# Patient Record
Sex: Male | Born: 1970 | Race: Black or African American | Hispanic: No | Marital: Single | Smoking: Never smoker
Health system: Southern US, Community
[De-identification: ages and names within clinical notes are randomized; demographics above are authoritative.]

## PROBLEM LIST (undated history)

## (undated) DIAGNOSIS — I10 Essential (primary) hypertension: Secondary | ICD-10-CM

## (undated) DIAGNOSIS — G4730 Sleep apnea, unspecified: Secondary | ICD-10-CM

## (undated) DIAGNOSIS — E119 Type 2 diabetes mellitus without complications: Secondary | ICD-10-CM

## (undated) DIAGNOSIS — G629 Polyneuropathy, unspecified: Secondary | ICD-10-CM

## (undated) DIAGNOSIS — M6281 Muscle weakness (generalized): Secondary | ICD-10-CM

## (undated) DIAGNOSIS — M9420 Chondromalacia, unspecified site: Secondary | ICD-10-CM

## (undated) HISTORY — DX: Sleep apnea, unspecified: G47.30

## (undated) HISTORY — DX: Chondromalacia, unspecified site: M94.20

## (undated) HISTORY — DX: Polyneuropathy, unspecified: G62.9

## (undated) HISTORY — PX: TONSILLECTOMY: SUR1361

## (undated) HISTORY — DX: Muscle weakness (generalized): M62.81

---

## 2010-12-13 DIAGNOSIS — S335XXA Sprain of ligaments of lumbar spine, initial encounter: Secondary | ICD-10-CM

## 2010-12-13 HISTORY — DX: Sprain of ligaments of lumbar spine, initial encounter: S33.5XXA

## 2017-04-30 ENCOUNTER — Other Ambulatory Visit: Payer: Self-pay

## 2017-04-30 ENCOUNTER — Emergency Department (HOSPITAL_COMMUNITY)
Admission: EM | Admit: 2017-04-30 | Discharge: 2017-04-30 | Discharge status: Against Medical Advice | Payer: Medicaid Other | Attending: Emergency Medicine | Admitting: Emergency Medicine

## 2017-04-30 ENCOUNTER — Encounter (HOSPITAL_COMMUNITY): Payer: Self-pay

## 2017-04-30 ENCOUNTER — Emergency Department (HOSPITAL_COMMUNITY): Payer: Medicaid Other

## 2017-04-30 DIAGNOSIS — R0789 Other chest pain: Secondary | ICD-10-CM | POA: Diagnosis present

## 2017-04-30 DIAGNOSIS — Z5321 Procedure and treatment not carried out due to patient leaving prior to being seen by health care provider: Secondary | ICD-10-CM | POA: Diagnosis not present

## 2017-04-30 HISTORY — DX: Type 2 diabetes mellitus without complications: E11.9

## 2017-04-30 LAB — BASIC METABOLIC PANEL
Anion gap: 9 (ref 5–15)
BUN: 10 mg/dL (ref 6–20)
CALCIUM: 9.1 mg/dL (ref 8.9–10.3)
CO2: 31 mmol/L (ref 22–32)
CREATININE: 1 mg/dL (ref 0.61–1.24)
Chloride: 99 mmol/L — ABNORMAL LOW (ref 101–111)
GFR calc Af Amer: >60 mL/min (ref >60)
GLUCOSE: 260 mg/dL — AB (ref 65–99)
Potassium: 4.6 mmol/L (ref 3.5–5.1)
Sodium: 139 mmol/L (ref 135–145)

## 2017-04-30 LAB — CBC
HCT: 41.9 % (ref 39.0–52.0)
HEMOGLOBIN: 13.5 g/dL (ref 13.0–17.0)
MCH: 23.7 pg — AB (ref 26.0–34.0)
MCHC: 32.2 g/dL (ref 30.0–36.0)
MCV: 73.5 fL — ABNORMAL LOW (ref 78.0–100.0)
Platelets: 164 10*3/uL (ref 150–400)
RBC: 5.7 MIL/uL (ref 4.22–5.81)
RDW: 16 % — AB (ref 11.5–15.5)
WBC: 9.7 10*3/uL (ref 4.0–10.5)

## 2017-04-30 LAB — I-STAT TROPONIN, ED: TROPONIN I, POC: 0 ng/mL (ref 0.00–0.08)

## 2017-04-30 IMAGING — CR DG CHEST 2V
2 series · 2 of 2 positions shown · non-contrast
Comparison: None.

CLINICAL DATA: Chest discomfort.  Shortness of breath.

EXAM:
CHEST  2 VIEW

[w chest pa]
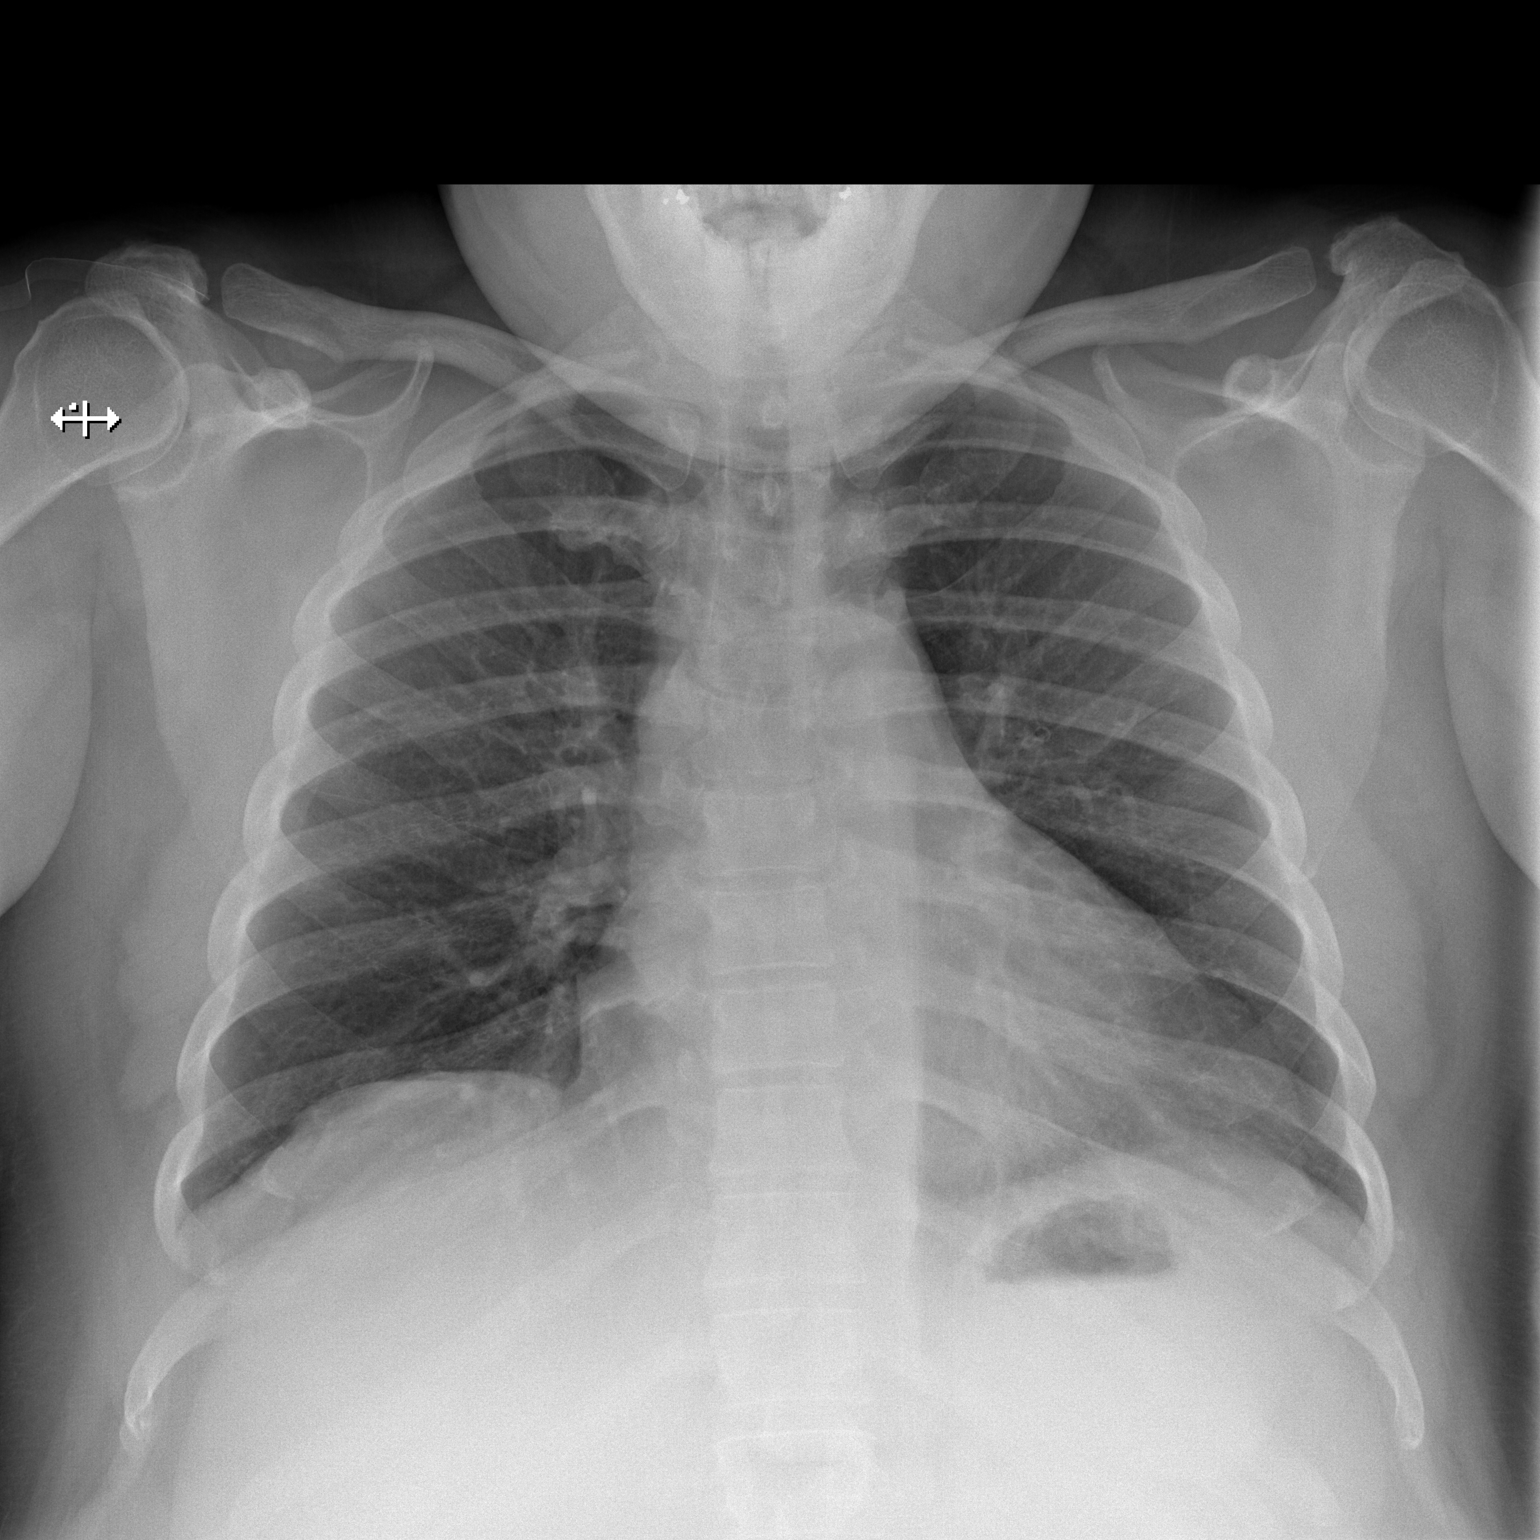

[w chest lat]
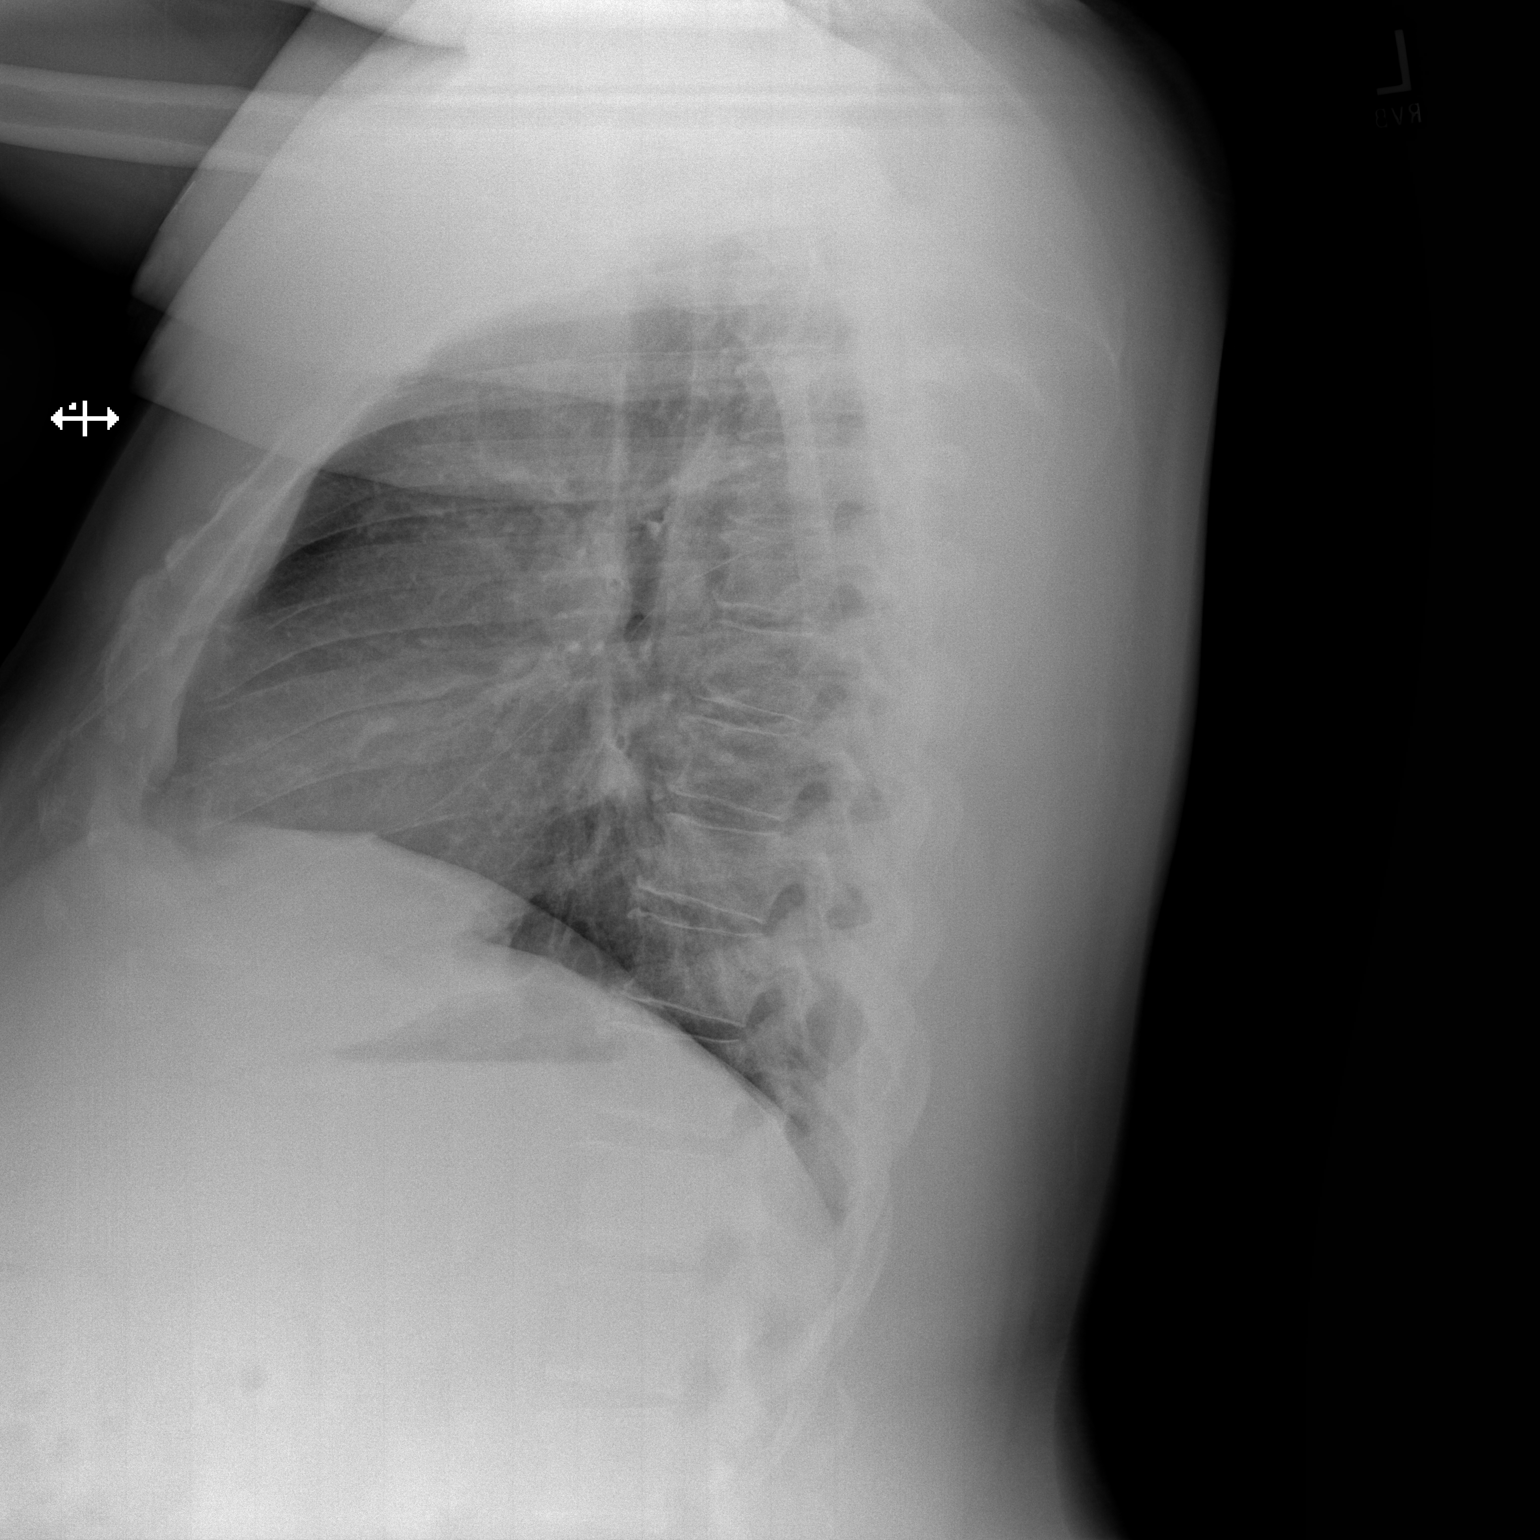

[2 of 2 positions shown; findings below may reference images not displayed]

FINDINGS: Heart, hila, and mediastinum demonstrate no acute abnormalities. No
pneumothorax. No pulmonary nodules or masses. Prominent fat pad at
the left cardiac apex. The heart may be borderline in size.
IMPRESSION: No active cardiopulmonary disease.

## 2017-04-30 NOTE — ED Triage Notes (Signed)
He c/o chest pain and shortness of breath since yesterday morning. He is in no distress. His skin is normal, warm and dry.

## 2017-04-30 NOTE — ED Notes (Signed)
Blood draw attempt unsuccessful 

## 2017-04-30 NOTE — ED Notes (Signed)
Called to take to room  No response from lobby  

## 2017-04-30 NOTE — ED Notes (Signed)
No answer when called to reassess vitals. 

## 2018-07-31 ENCOUNTER — Other Ambulatory Visit (HOSPITAL_COMMUNITY): Payer: Self-pay | Admitting: Family Medicine

## 2018-07-31 ENCOUNTER — Other Ambulatory Visit: Payer: Self-pay | Admitting: Family Medicine

## 2018-07-31 DIAGNOSIS — R0602 Shortness of breath: Secondary | ICD-10-CM

## 2018-08-10 ENCOUNTER — Ambulatory Visit (HOSPITAL_COMMUNITY)
Admission: RE | Admit: 2018-08-10 | Discharge: 2018-08-10 | Disposition: A | Discharge status: Home / Self Care | Payer: Medicaid Other | Source: Ambulatory Visit | Attending: Family Medicine | Admitting: Family Medicine

## 2018-08-10 ENCOUNTER — Encounter: Payer: Self-pay | Admitting: Student

## 2018-08-10 ENCOUNTER — Encounter (HOSPITAL_COMMUNITY): Payer: Self-pay

## 2018-08-10 ENCOUNTER — Other Ambulatory Visit: Payer: Self-pay

## 2018-08-10 DIAGNOSIS — R0602 Shortness of breath: Secondary | ICD-10-CM

## 2018-08-10 HISTORY — DX: Essential (primary) hypertension: I10

## 2018-08-10 LAB — NM MYOCAR MULTI W/SPECT W/WALL MOTION / EF
Estimated workload: 1 METS
Exercise duration (min): 0 min
Exercise duration (sec): 0 s
MPHR: 172 {beats}/min
Peak HR: 108 {beats}/min
Percent HR: 62 %
Rest HR: 75 {beats}/min

## 2018-08-10 MED ORDER — TECHNETIUM TC 99M TETROFOSMIN IV KIT
10.0000 | PACK | Freq: Once | INTRAVENOUS | Status: AC | PRN
  Administered 2018-08-10: 10 via INTRAVENOUS

## 2018-08-10 MED ORDER — REGADENOSON 0.4 MG/5ML IV SOLN
0.4000 mg | Freq: Once | INTRAVENOUS | Status: AC
  Administered 2018-08-10: 12:00:00 0.4 mg via INTRAVENOUS

## 2018-08-10 MED ORDER — TECHNETIUM TC 99M TETROFOSMIN IV KIT
30.0000 | PACK | Freq: Once | INTRAVENOUS | Status: AC | PRN
  Administered 2018-08-10: 30 via INTRAVENOUS

## 2018-08-10 MED ORDER — REGADENOSON 0.4 MG/5ML IV SOLN
INTRAVENOUS | Status: AC
  Administered 2018-08-10: 0.4 mg via INTRAVENOUS
  Filled 2018-08-10: qty 5

## 2018-08-10 NOTE — Progress Notes (Signed)
   Patient presented to nuclear medicine today for [facility] which was ordered by Dr. Yetta Barre at [facility] for evaluation of dyspnea on exertion.   Brief Exam: General: 48 year old African-American male resting comfortably in no acute distress. Heart: RRR. No significant murmurs, gallops, or rubs appreciated. Lungs: No increased work of breathing. Clear to auscultation bilaterally. No wheezes, rhonchi, or rales.  No immediate complications. Stress imaging is pending at this time.   Preliminary EKG findings may be listed in the chart, but the stress test result will not be finalized until perfusion imaging is complete.  [facility] to read and Dr. Yetta Barre will follow-up with patient.d  Corrin Parker, PA-C 08/10/2018 12:36 PM

## 2018-08-10 NOTE — Progress Notes (Addendum)
Pt states he does not have any allergies to medications or other substances.  Pt states he does not have a list of his medications and cannot provide accurate doses and all meds he takes at this time.  Pt denies SOB or CP at this time.  This is an outpatient NM lexiscan.  Found 22g PIV to LAC, +blood return, +flush, saline locked, PIV is patent

## 2019-09-19 ENCOUNTER — Encounter: Payer: Self-pay | Admitting: *Deleted

## 2019-09-19 ENCOUNTER — Other Ambulatory Visit: Payer: Self-pay | Admitting: *Deleted

## 2019-09-20 ENCOUNTER — Encounter: Payer: Self-pay | Admitting: Neurology

## 2019-09-20 ENCOUNTER — Ambulatory Visit (INDEPENDENT_AMBULATORY_CARE_PROVIDER_SITE_OTHER): Payer: Medicaid Other | Admitting: Neurology

## 2019-09-20 ENCOUNTER — Other Ambulatory Visit: Payer: Self-pay

## 2019-09-20 VITALS — BP 120/74 | HR 79 | Ht 65.0 in | Wt 221.5 lb

## 2019-09-20 DIAGNOSIS — R202 Paresthesia of skin: Secondary | ICD-10-CM | POA: Insufficient documentation

## 2019-09-20 DIAGNOSIS — M5442 Lumbago with sciatica, left side: Secondary | ICD-10-CM

## 2019-09-20 DIAGNOSIS — G8929 Other chronic pain: Secondary | ICD-10-CM

## 2019-09-20 NOTE — Progress Notes (Signed)
HISTORICAL  Noah Willis is a 49 year old male, seen in request by his orthopedic surgeon Dr. Shon Baton, [facility] Garret, for evaluation of possible peripheral neuropathy, initial evaluation was on September 20, 2019.  His primary care physician is at [facility].  I reviewed and summarized the referring note, past medical history of Diabetes, since 2017, on the poorly controlled, A1c was 14, most recent one was 8.8 per patient Hyperlipidemia, Lipitor 80 mg daily Hypertension, Diovan 160 mg daily Morbid Obesity Obstructive sleep apnea, was given CPAP machine 2020, but could not tolerate it.  Early 2020, he began to notice left anterior thigh area numbness tingling, palm size, over the next few months, he began to experience left-sided low back pain, radiating pain towards the left hip, posterior thigh , left calf, and the left foot, he denies significant right lower extremity involvement, he was seen by orthopedic surgeon Dr. Shon Baton, was given gabapentin, which helped his symptoms some, he can sleep better with the medication  Since beginning of 2021, he also began to experience intermittent bilateral hands paresthesia, left worse than right, he is left-hand-dominant, used to work as a maintenance man, he complains of difficulty using tools, if he holds it for a while, he complains of left hand muscle achy pain, has to let go, recent few weeks, he also experience intermittent left hand numbness, woke him up from sleep, sometimes numbness extending to involving whole left arm, similar but much milder involvement of left hand.  He has history of chronic low back pain, getting worse since 2020, radiating pain towards the left side, tried over-the-counter without significant benefit, gabapentin, titrating dose up to 600 mg twice a day, Flexeril, has helped his symptoms,  Laboratory evaluation February 2020, A1c more than 14,  Myocardial infusion study May 2020, no reversible ischemia, normal left  ventricular function.    REVIEW OF SYSTEMS: Full 14 system review of systems performed and notable only for as above All other review of systems were negative.  ALLERGIES: Allergies  Allergen Reactions  . Ace Inhibitors Cough and Other (See Comments)    Cough Cough   . Hydrochlorothiazide Other (See Comments)    Other Reaction: Erectile Dysfunction Other Reaction: Erectile Dysfunction     HOME MEDICATIONS: Current Outpatient Medications  Medication Sig Dispense Refill  . atorvastatin (LIPITOR) 80 MG tablet Take 80 mg by mouth daily.    . cyclobenzaprine (FLEXERIL) 10 MG tablet Take 10 mg by mouth 2 (two) times daily.    Marland Kitchen doxazosin (CARDURA) 2 MG tablet Take 2 mg by mouth daily.    Marland Kitchen gabapentin (NEURONTIN) 300 MG capsule Take 600 mg by mouth 2 (two) times daily.     Marland Kitchen glipiZIDE (GLUCOTROL XL) 10 MG 24 hr tablet Take 20 mg by mouth daily.    Marland Kitchen JANUMET XR 50-1000 MG TB24 Take 2 tablets by mouth daily.    . meloxicam (MOBIC) 7.5 MG tablet Take 7.5 mg by mouth daily.    . valsartan (DIOVAN) 160 MG tablet Take 160 mg by mouth daily.    . Vitamin D, Ergocalciferol, (DRISDOL) 1.25 MG (50000 UNIT) CAPS capsule Take 50,000 Units by mouth every 7 (seven) days.    . Dulaglutide 1.5 MG/0.5ML SOPN Inject into the skin. (Patient not taking: Reported on 09/20/2019)    . liraglutide (VICTOZA) 18 MG/3ML SOPN Inject 1.2 mg into the skin daily.     No current facility-administered medications for this visit.    PAST MEDICAL HISTORY: Past Medical History:  Diagnosis  Date  . Chondromalacia   . Diabetes mellitus without complication (HCC)   . Hypertension   . Lumbar sprain 12/2010  . Muscle weakness   . Peripheral neuropathy   . Sleep apnea     PAST SURGICAL HISTORY: Past Surgical History:  Procedure Laterality Date  . TONSILLECTOMY      FAMILY HISTORY: History reviewed. No pertinent family history.  SOCIAL HISTORY: Social History   Socioeconomic History  . Marital status:  Single    Spouse name: Not on file  . Number of children: Not on file  . Years of education: Not on file  . Highest education level: Not on file  Occupational History    Comment: truck driver  Tobacco Use  . Smoking status: Never Smoker  . Smokeless tobacco: Never Used  Substance and Sexual Activity  . Alcohol use: Not Currently  . Drug use: Never  . Sexual activity: Not on file  Other Topics Concern  . Not on file  Social History Narrative   Right handed   No caffeine use   Social Determinants of Health   Financial Resource Strain:   . Difficulty of Paying Living Expenses:   Food Insecurity:   . Worried About Programme researcher, broadcasting/film/video in the Last Year:   . Barista in the Last Year:   Transportation Needs:   . Freight forwarder (Medical):   Marland Kitchen Lack of Transportation (Non-Medical):   Physical Activity:   . Days of Exercise per Week:   . Minutes of Exercise per Session:   Stress:   . Feeling of Stress :   Social Connections:   . Frequency of Communication with Friends and Family:   . Frequency of Social Gatherings with Friends and Family:   . Attends Religious Services:   . Active Member of Clubs or Organizations:   . Attends Banker Meetings:   Marland Kitchen Marital Status:   Intimate Partner Violence:   . Fear of Current or Ex-Partner:   . Emotionally Abused:   Marland Kitchen Physically Abused:   . Sexually Abused:      PHYSICAL EXAM   Vitals:   09/20/19 0914  BP: 120/74  Pulse: 79  Weight: 221 lb 8 oz (100.5 kg)  Height: 5\' 5"  (1.651 m)   Not recorded     Body mass index is 36.86 kg/m.  PHYSICAL EXAMNIATION:  Gen: NAD, conversant, well nourised, well groomed                     Cardiovascular: Regular rate rhythm, no peripheral edema, warm, nontender. Eyes: Conjunctivae clear without exudates or hemorrhage Neck: Supple, no carotid bruits. Pulmonary: Clear to auscultation bilaterally   NEUROLOGICAL EXAM:  MENTAL STATUS: Speech:    Speech is  normal; fluent and spontaneous with normal comprehension.  Cognition:     Orientation to time, place and person     Normal recent and remote memory     Normal Attention span and concentration     Normal Language, naming, repeating,spontaneous speech     Fund of knowledge   CRANIAL NERVES: CN II: Visual fields are full to confrontation. Pupils are round equal and briskly reactive to light. CN III, IV, VI: extraocular movement are normal. No ptosis. CN V: Facial sensation is intact to light touch CN VII: Face is symmetric with normal eye closure  CN VIII: Hearing is normal to causal conversation. CN IX, X: Phonation is normal. CN XI: Head turning and shoulder  shrug are intact  MOTOR: There is no pronator drift of out-stretched arms. Muscle bulk and tone are normal. Muscle strength is normal.  REFLEXES: Reflexes are 1  and symmetric at the biceps, triceps, knees, and absent at ankles. Plantar responses are flexor.  SENSORY: Intact to light touch, pinprick and vibratory sensation are intact in fingers and toes.  COORDINATION: There is no trunk or limb dysmetria noted.  GAIT/STANCE: He can get up from seated position with arms crossed, gait is steady with normal steps, base, arm swing, and turning. Heel and toe walking are normal. Tandem gait is normal.  Romberg is absent.   DIAGNOSTIC DATA (LABS, IMAGING, TESTING) - I reviewed patient records, labs, notes, testing and imaging myself where available.   ASSESSMENT AND PLAN  Noah Willis is a 49 y.o. male   Left-sided low back pain, radiating pain to left lower extremity, Bilateral upper and lower extremity paresthesia, left worse than right, History of diabetes, poorly controlled, A1c up to 14  Differentiation diagnosis include left lumbar radiculopathy, peripheral neuropathy, with superimposed left carpal tunnel syndrome  EMG nerve conduction study  MRI of the lumbar spine  Get laboratory evaluation result from his primary  care physician   Levert Feinstein, M.D. Ph.D.  [facility] [address], [address] [place], [place] [address] Ph: [phone] Fax: [phone]  CC:  Venita Lick, MD

## 2019-09-24 ENCOUNTER — Telehealth: Payer: Self-pay | Admitting: Neurology

## 2019-09-24 NOTE — Telephone Encounter (Signed)
[employer] healthy blue medicaid pending uploaded notes

## 2019-09-25 NOTE — Telephone Encounter (Signed)
Checked the healthy blue portal it is still pending.  

## 2019-10-07 NOTE — Telephone Encounter (Signed)
healthy blue medicaid Berkley Harvey: [id number] (exp. 09/24/19 to 11/22/19) order sent to GI. They will reach out to the patient to schedule.

## 2019-10-13 ENCOUNTER — Ambulatory Visit
Admission: RE | Admit: 2019-10-13 | Discharge: 2019-10-13 | Disposition: A | Discharge status: Home / Self Care | Payer: Medicaid Other | Source: Ambulatory Visit | Attending: Neurology | Admitting: Neurology

## 2019-10-13 DIAGNOSIS — G8929 Other chronic pain: Secondary | ICD-10-CM | POA: Diagnosis not present

## 2019-10-13 DIAGNOSIS — M5442 Lumbago with sciatica, left side: Secondary | ICD-10-CM | POA: Diagnosis not present

## 2019-10-13 DIAGNOSIS — R202 Paresthesia of skin: Secondary | ICD-10-CM

## 2019-10-15 ENCOUNTER — Telehealth: Payer: Self-pay | Admitting: *Deleted

## 2019-10-15 NOTE — Telephone Encounter (Signed)
Pt returned phone call.  

## 2019-10-15 NOTE — Telephone Encounter (Signed)
-----   Message from Levert Feinstein, MD sent at 10/15/2019  9:42 AM EDT ----- Please call pt MRI of lumbar spine showed mild disc degeneration change at L5-S1, there was no significant canal or foraminal stenosis.

## 2019-10-15 NOTE — Telephone Encounter (Signed)
Left message requesting a return call.

## 2019-10-15 NOTE — Telephone Encounter (Signed)
I have returned the call to the patient. He verbalized understanding of the findings. He will keep his pending appt for NCV/EMG.

## 2019-10-16 ENCOUNTER — Encounter: Payer: Self-pay | Admitting: Neurology

## 2019-11-06 ENCOUNTER — Encounter: Payer: Medicaid Other | Admitting: Neurology

## 2019-12-09 ENCOUNTER — Encounter: Payer: Medicaid Other | Admitting: Neurology

## 2020-05-12 ENCOUNTER — Ambulatory Visit: Payer: Medicaid Other | Admitting: Internal Medicine

## 2021-07-28 ENCOUNTER — Ambulatory Visit: Payer: Medicaid Other | Admitting: Family Medicine

## 2022-06-29 ENCOUNTER — Encounter: Payer: Self-pay | Admitting: *Deleted
